# Patient Record
Sex: Male | Born: 1965 | Hispanic: Yes | Marital: Married | Smoking: Never smoker
Health system: Southern US, Community
[De-identification: ages and names within clinical notes are randomized; demographics above are authoritative.]

---

## 2010-04-21 ENCOUNTER — Emergency Department: Payer: Self-pay | Admitting: Emergency Medicine

## 2010-04-22 ENCOUNTER — Emergency Department: Payer: Self-pay | Admitting: Emergency Medicine

## 2012-10-11 ENCOUNTER — Ambulatory Visit: Payer: Self-pay | Admitting: Gastroenterology

## 2012-10-11 LAB — BASIC METABOLIC PANEL
BUN: 12 mg/dL (ref 7–18)
Calcium, Total: 8.7 mg/dL (ref 8.5–10.1)
Chloride: 107 mmol/L (ref 98–107)
Co2: 31 mmol/L (ref 21–32)
Glucose: 118 mg/dL — ABNORMAL HIGH (ref 65–99)
Osmolality: 278 (ref 275–301)
Potassium: 3.3 mmol/L — ABNORMAL LOW (ref 3.5–5.1)

## 2012-10-11 LAB — CBC
MCHC: 34.7 g/dL (ref 32.0–36.0)
Platelet: 274 10*3/uL (ref 150–440)
WBC: 9.9 10*3/uL (ref 3.8–10.6)

## 2014-05-04 IMAGING — CR DG CHEST 2V
1 series · 2 of 2 positions shown · non-contrast
Comparison: none

REASON FOR EXAM: pt states he swallowed a foreign body
COMMENTS:

[Series 1: w chest pa · 0.14mm/px · 2 of 2 slices shown]
[im 1/2]
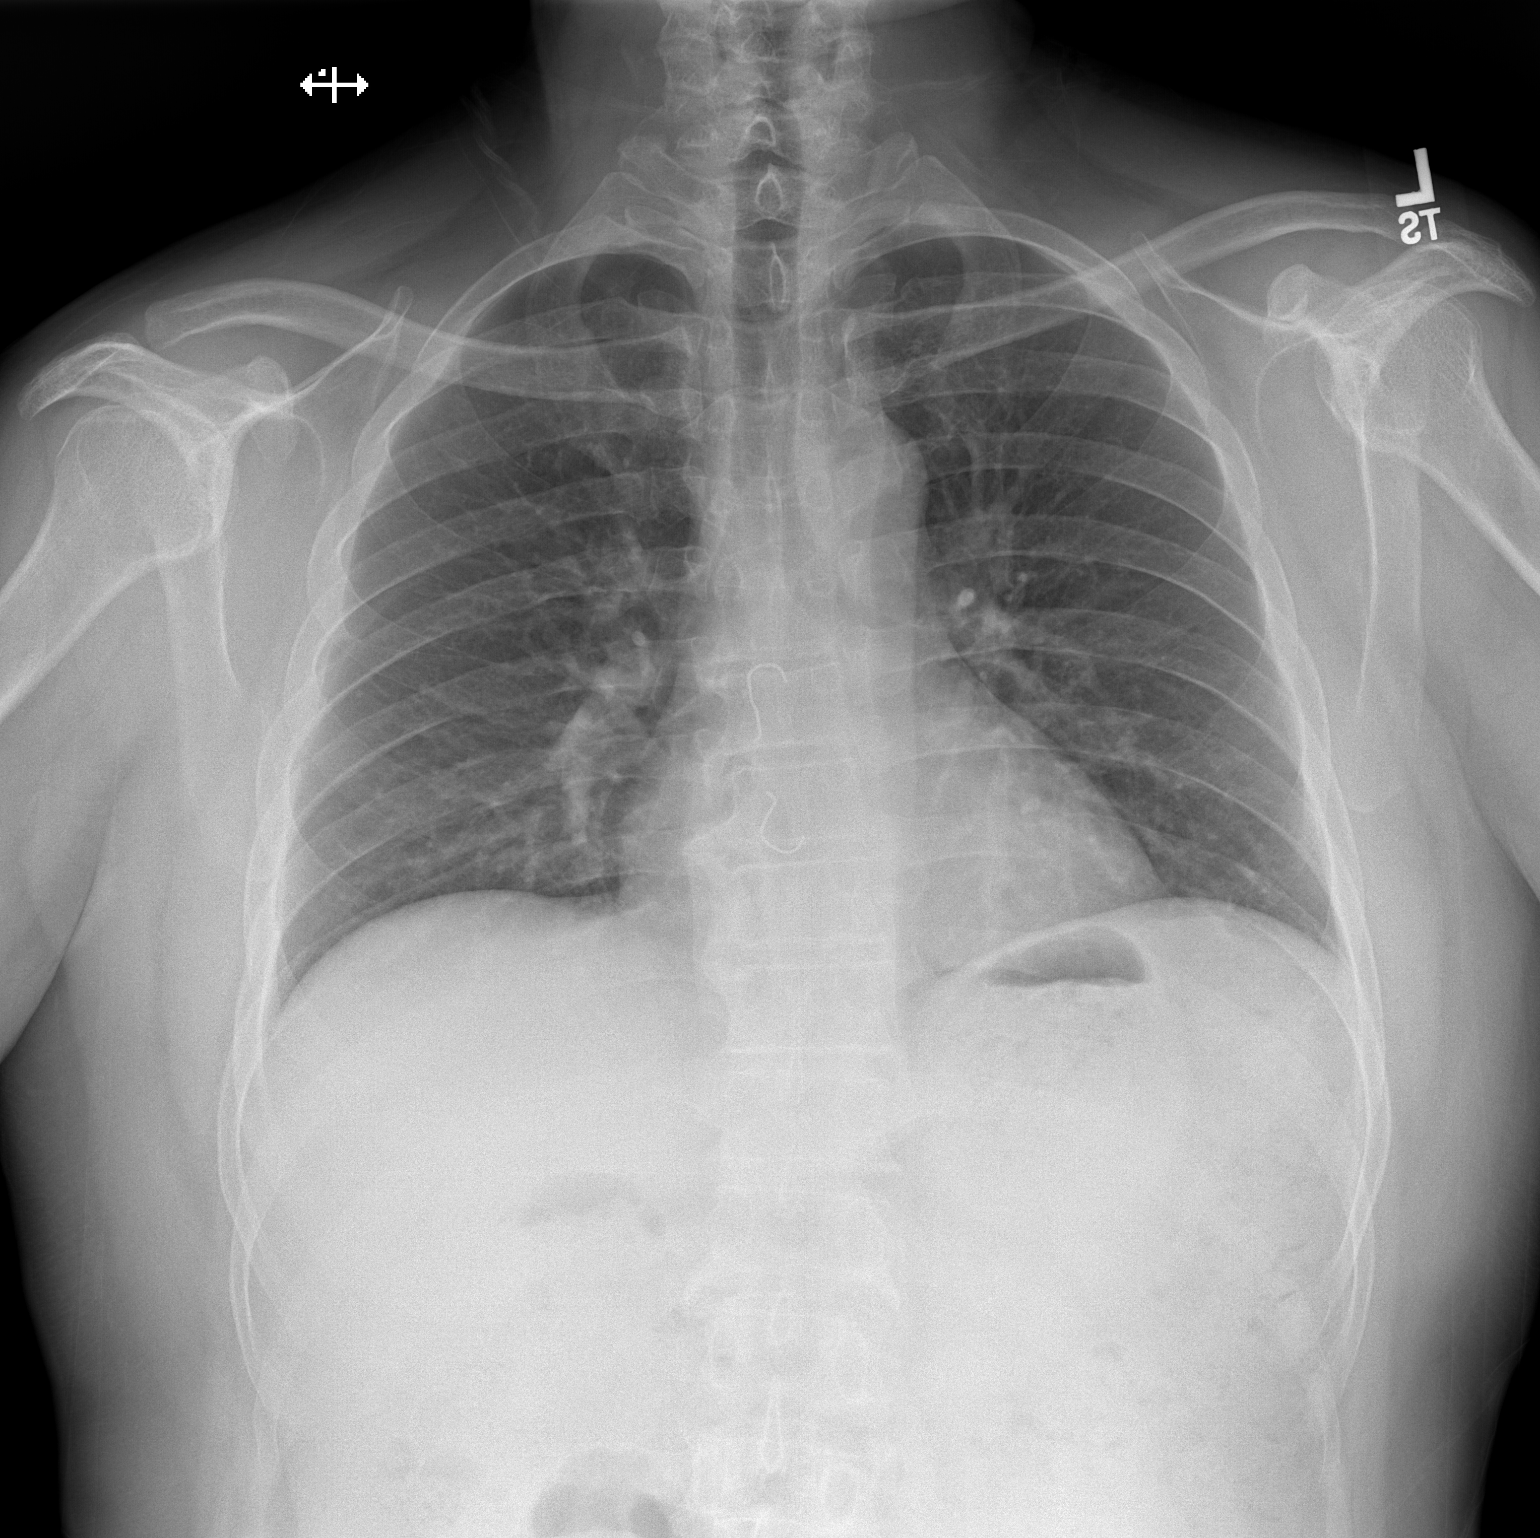
[im 2/2]
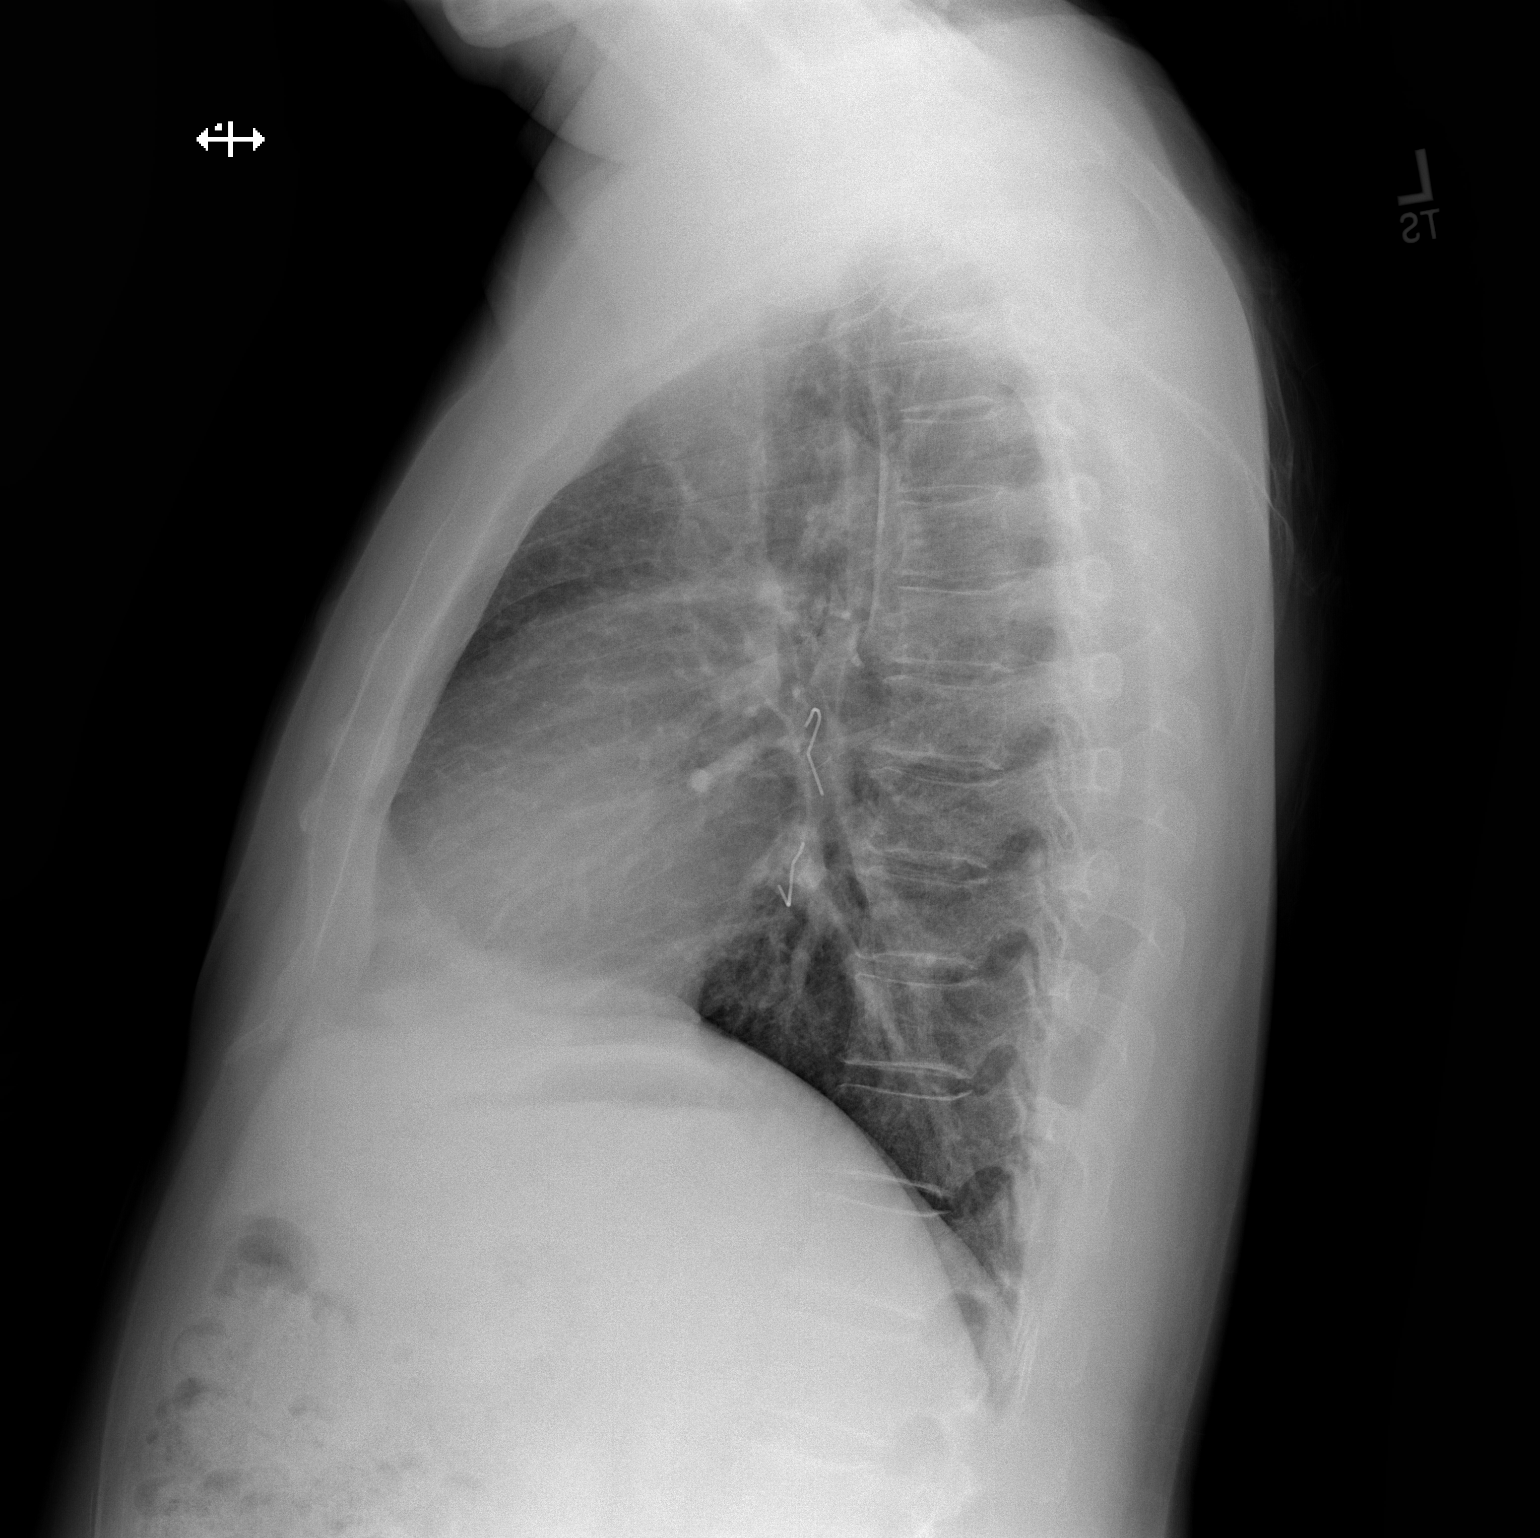

[2 of 2 positions shown; findings below may reference images not displayed]

PROCEDURE:     DXR - DXR CHEST PA (OR AP) AND LATERAL  - October 11, 2012  [DATE]

RESULT:     Curvilinear densities project in in the posterior mediastinal
region consistent with a history of possible ingested foreign body, possibly
in the esophagus. Endoscopic correlation is recommended. The lungs are
clear. The heart and pulmonary vessels are normal. There is no edema,
effusion or pneumothorax. No pneumomediastinum is demonstrated.
IMPRESSION: 1. Findings suggest foreign bodies in the esophagus with 2 curvilinear
metallic densities seen. The GI consultation is recommended.

[REDACTED]

## 2015-01-18 ENCOUNTER — Emergency Department
Admission: EM | Admit: 2015-01-18 | Discharge: 2015-01-18 | Disposition: A | Discharge status: Home / Self Care | Payer: Self-pay | Attending: Emergency Medicine | Admitting: Emergency Medicine

## 2015-01-18 ENCOUNTER — Encounter: Payer: Self-pay | Admitting: Emergency Medicine

## 2015-01-18 DIAGNOSIS — R112 Nausea with vomiting, unspecified: Secondary | ICD-10-CM | POA: Insufficient documentation

## 2015-01-18 DIAGNOSIS — R197 Diarrhea, unspecified: Secondary | ICD-10-CM | POA: Insufficient documentation

## 2015-01-18 LAB — COMPREHENSIVE METABOLIC PANEL
ALT: 14 U/L — ABNORMAL LOW (ref 17–63)
ANION GAP: 9 (ref 5–15)
AST: 27 U/L (ref 15–41)
Albumin: 4.2 g/dL (ref 3.5–5.0)
Alkaline Phosphatase: 122 U/L (ref 38–126)
BILIRUBIN TOTAL: 1 mg/dL (ref 0.3–1.2)
BUN: 15 mg/dL (ref 6–20)
CALCIUM: 8.8 mg/dL — AB (ref 8.9–10.3)
CO2: 24 mmol/L (ref 22–32)
Chloride: 102 mmol/L (ref 101–111)
Creatinine, Ser: 0.74 mg/dL (ref 0.61–1.24)
GFR calc Af Amer: >60 mL/min (ref >60)
GLUCOSE: 138 mg/dL — AB (ref 65–99)
POTASSIUM: 3.5 mmol/L (ref 3.5–5.1)
SODIUM: 135 mmol/L (ref 135–145)
Total Protein: 8.8 g/dL — ABNORMAL HIGH (ref 6.5–8.1)

## 2015-01-18 LAB — CBC
HEMATOCRIT: 44.2 % (ref 40.0–52.0)
HEMOGLOBIN: 14.9 g/dL (ref 13.0–18.0)
MCH: 28.9 pg (ref 26.0–34.0)
MCHC: 33.7 g/dL (ref 32.0–36.0)
MCV: 85.7 fL (ref 80.0–100.0)
PLATELETS: 264 10*3/uL (ref 150–440)
RBC: 5.15 MIL/uL (ref 4.40–5.90)
RDW: 13.6 % (ref 11.5–14.5)
WBC: 5.4 10*3/uL (ref 3.8–10.6)

## 2015-01-18 LAB — LIPASE, BLOOD: LIPASE: 16 U/L — AB (ref 22–51)

## 2015-01-18 MED ORDER — ONDANSETRON HCL 4 MG PO TABS: 4.0000 mg | ORAL_TABLET | Freq: Every day | ORAL | Status: AC | PRN

## 2015-01-18 MED ORDER — ONDANSETRON 4 MG PO TBDP
4.0000 mg | ORAL_TABLET | Freq: Once | ORAL | Status: AC
  Administered 2015-01-18: 4 mg via ORAL
  Filled 2015-01-18: qty 1

## 2015-01-18 NOTE — ED Notes (Signed)
Patient with generalized abd pain, nausea and vomiting since Thursday.

## 2015-01-18 NOTE — Discharge Instructions (Signed)
Tome Zofran / metoclopramida si tiene nuseas. Volver a la sala de urgencias si tiene dolor que empeora o si tiene otras preocupaciones urgentes.  Take zofran/odansetron if you have nausea. Return to the Emergency Department if you have pain that gets worse or if you have other urgent concerns.   Diarrea  (Diarrhea) La diarrea es la materia fecal (heces) acuosas. Puede hacerlo sentir dbil, cansado, sediento, o con la boca seca (signos de deshidratacin). La materia fecal acuosa es signo de otro problema, generalmente una infeccin. Suele durar 2 o 3 das. Puede durar ms tiempo si es el signo de una enfermedad grave. Cudese segn lo que le indique su mdico.  CUIDADOS EN EL HOGAR   Beba 1 taza (8 onzas) de lquido cada vez que tenga una deposicin acuosa.  No beba los siguientes lquidos:  Los que contengan azcares simples (fructosa, glucosa, galactosa, [place], sacarosa, maltosa).  Bebidas deportivas.  Jugos de fruta.  Productos lcteos enteros.  Gaseosas.  Bebidas con cafena (caf, t, gaseosas) o alcohol.  Puede usar soluciones de rehidratacin oral si su mdico lo autoriza. Usted mismo puede preparar la solucin. Siga esta receta:   -  cucharadita de sal.   cucharadita de bicarbonato de soda.   de cucharadita de sal sustituta que contenga cloruro de potasio.  1  cucharada de azcar.  1 litros (34 oz) de agua.  Evite los siguientes alimentos:  Los que tienen gran contenido de [place], como frutas y verduras.  Frutas secas, semillas y panes y cereales integrales.   Las endulzadas con alcohol de azcar (xylitol, sorbitol, manitol).  Trate de comer los siguientes alimentos:  Los que tienen almidn, como arroz, pan, pasta, cereales bajos en azcar, avena, smola de maz, papas al horno, galletas y panecillos.  Bananas.  Pur de [employer].  Consuma alimentos ricos en probiticos, como yogur y productos lcteos fermentados.  Lvese bien las manos cada vez que  tenga una deposicin acuosa.  Slo tome los medicamentos que le haya indicado su mdico.  Tome un bao caliente para ayudar a Engineer, manufacturing systems ardor o dolor que producen las deposiciones aguadas. SOLICITE AYUDA DE INMEDIATO SI:   No puede beber lquidos sin devolver vomitar.  Sigue vomitando.  Tiene sangre en la materia fecal, o es de color negro y de aspecto alquitranado.  No hay emisin de [place] durante 6 a 8 horas o elimina una pequea cantidad de [place].  Usted tiene dolor de estmago (abdominal) que empeora o se mantiene en el mismo lugar (localiza).  Se siente dbil, mareado, confundido o se desmaya.  Siente un dolor de cabeza muy intenso.  La materia fecal acuosa no mejora.  Tiene fiebre o sntomas que persisten durante ms de 2-3 das.  Tiene fiebre y los sntomas empeoran de manera sbita. ASEGRESE DE QUE:   Comprende estas instrucciones.  Controlar su enfermedad.  Solicitar ayuda de inmediato si no mejora o si empeora. Document Released: 05/10/2012 Document Revised: 10/08/2013 [facility] Patient Information 2015 [place], [place]. This information is not intended to replace advice given to you by your health care provider. Make sure you discuss any questions you have with your health care provider.

## 2015-01-18 NOTE — ED Provider Notes (Signed)
[facility] Emergency Department Provider Note  ____________________________________________  Time seen: 6:10 AM  I have reviewed the triage vital signs and the nursing notes.   HISTORY  Chief Complaint Abdominal Pain and Emesis     HPI Russell Holloway is a 49 y.o. male  who reports he has been having nausea, vomiting, some diarrhea, and abdominal discomfort, for the past 3 days.  The reports she has not had significant problem like this recently. He does not have any abdominal issues ordinarily. No one else is sick in the family currently. He denies any other health problems. He does not take any medications.  He reports that anytime he eats or drinks he gets more nauseous and sometimes throws up.   Past medical history: No chronic medical conditions.  There are no active problems to display for this patient.   No past surgical history on file.  Current Outpatient Rx  Name  Route  Sig  Dispense  Refill  . ondansetron (ZOFRAN) 4 MG tablet   Oral   Take 1 tablet (4 mg total) by mouth daily as needed for nausea or vomiting.   10 tablet   0     Allergies Review of patient's allergies indicates no known allergies.  History reviewed. No pertinent family history.  Social History Social History  Substance Use Topics  . Smoking status: Never Smoker   . Smokeless tobacco: None  . Alcohol Use: No    Review of Systems  Constitutional: Negative for fever. ENT: Negative for sore throat. Cardiovascular: Negative for chest pain. Respiratory: Negative for shortness of breath. Gastrointestinal: Positive for abdominal pain, vomiting and diarrhea. Genitourinary: Negative for dysuria. Musculoskeletal: No myalgias or injuries. Skin: Negative for rash. Neurological: Negative for headaches   10-point ROS otherwise negative.  ____________________________________________   PHYSICAL EXAM:  VITAL SIGNS: ED Triage Vitals  Enc Vitals Group   BP 01/18/15 0435 113/84 mmHg     Pulse Rate 01/18/15 0435 87     Resp 01/18/15 0435 18     Temp 01/18/15 0435 98.9 F (37.2 C)     Temp Source 01/18/15 0435 Oral     SpO2 01/18/15 0435 96 %     Weight --      Height 01/18/15 0435  (1.626 m)     Head Cir --      Peak Flow --      Pain Score 01/18/15 0436 7     Pain Loc --      Pain Edu? --      Excl. in GC? --     Constitutional: Alert and oriented. Well appearing and in no distress. ENT   Head: Normocephalic and atraumatic.   Nose: No congestion/rhinnorhea. Cardiovascular: Normal rate, regular rhythm, no murmur noted Respiratory:  Normal respiratory effort, no tachypnea.    Breath sounds are clear and equal bilaterally.  Gastrointestinal: Soft.  Normal bowel sounds . Minimal tenderness in the lower left abdomen. No air to nail signs No distention.  Back: No muscle spasm, no tenderness, no CVA tenderness. Musculoskeletal: No deformity noted. Nontender with normal range of motion in all extremities.  No noted edema. Neurologic:  Normal speech and language. No gross focal neurologic deficits are appreciated.  Skin:  Skin is warm, dry. No rash noted. Psychiatric: Mood and affect are normal. Speech and behavior are normal.  ____________________________________________    LABS (pertinent positives/negatives)  Labs Reviewed  LIPASE, BLOOD - Abnormal; Notable for the following:  Lipase 16 (*)    All other components within normal limits  COMPREHENSIVE METABOLIC PANEL - Abnormal; Notable for the following:    Glucose, Bld 138 (*)    Calcium 8.8 (*)    Total Protein 8.8 (*)    ALT 14 (*)    All other components within normal limits  CBC  URINALYSIS COMPLETEWITH MICROSCOPIC ([facility] ONLY)     ____________________________________________  ____________________________________________   INITIAL IMPRESSION / ASSESSMENT AND PLAN / ED COURSE  Pertinent labs & imaging results that were available during my care of the  patient were reviewed by me and considered in my medical decision making (see chart for details).   Well-appearing 49 year old male in no acute distress. He has minimal discomfort on exam. He has nausea vomiting and diarrhea. I suspect he does have some form of enteritis. His blood tests are overall normal.  I do not think that any imaging, such as CT scan, is indicated at this time. We will treat him for his nausea.  I have advised him to return to the emergency department if he has worsening symptoms or any other urgent concerns.    ____________________________________________   FINAL CLINICAL IMPRESSION(S) / ED DIAGNOSES  Final diagnoses:  Nausea vomiting and diarrhea      Darien Ramus, MD 01/18/15 [phone]

## 2023-06-03 ENCOUNTER — Emergency Department
Admission: EM | Admit: 2023-06-03 | Discharge: 2023-06-03 | Disposition: A | Discharge status: Home / Self Care | Payer: Worker's Compensation | Attending: Emergency Medicine | Admitting: Emergency Medicine

## 2023-06-03 ENCOUNTER — Emergency Department: Payer: Worker's Compensation

## 2023-06-03 ENCOUNTER — Other Ambulatory Visit: Payer: Self-pay

## 2023-06-03 DIAGNOSIS — W228XXA Striking against or struck by other objects, initial encounter: Secondary | ICD-10-CM | POA: Diagnosis not present

## 2023-06-03 DIAGNOSIS — Y990 Civilian activity done for income or pay: Secondary | ICD-10-CM | POA: Diagnosis not present

## 2023-06-03 DIAGNOSIS — S0990XA Unspecified injury of head, initial encounter: Secondary | ICD-10-CM

## 2023-06-03 DIAGNOSIS — S01111A Laceration without foreign body of right eyelid and periocular area, initial encounter: Secondary | ICD-10-CM | POA: Insufficient documentation

## 2023-06-03 NOTE — Discharge Instructions (Addendum)
The CT scan of your head and the xray of your right shoulder were both normal.  The glue that was applied to your eyebrow laceration will begin to fall off in about a week.  Please do not pick at it.  Watch for signs of infection including redness, warmth, swelling, pain and pus drainage.  If you develop any of these please return to the ED, urgent care or your primary care provider.  You can take 650 mg of Tylenol and 600 mg of ibuprofen every 6 hours as needed for pain.

## 2023-06-03 NOTE — ED Triage Notes (Signed)
Was walking down steps and ?slipped and fell landing on concrete floor.  Laceration right forehead.  No loc--per coworkers that were there when it happened.

## 2023-06-03 NOTE — ED Provider Notes (Signed)
[facility] Provider Note    Event Date/Time   First MD Initiated Contact with Patient 06/03/23 1528     (approximate)   History   Head Injury   HPI  Russell Holloway is a 57 y.o. male who presents for evaluation of a head injury that occurred at work today. Patient slipped down some stairs hitting his head and right shoulder. He sustained a small laceration to the right eye brow. Patient reports pain to the right eye brow and right shoulder.  Workman's comp injury.       Physical Exam   Triage Vital Signs: ED Triage Vitals  Encounter Vitals Group     BP 06/03/23 1322 (!) 149/95     Systolic BP Percentile --      Diastolic BP Percentile --      Pulse Rate 06/03/23 1322 79     Resp 06/03/23 1322 16     Temp 06/03/23 1322 98.2 F (36.8 C)     Temp Source 06/03/23 1322 Oral     SpO2 06/03/23 1322 97 %     Weight 06/03/23 1324 140 lb (63.5 kg)     Height --      Head Circumference --      Peak Flow --      Pain Score 06/03/23 1323 10     Pain Loc --      Pain Education --      Exclude from Growth Chart --     Most recent vital signs: Vitals:   06/03/23 1322  BP: (!) 149/95  Pulse: 79  Resp: 16  Temp: 98.2 F (36.8 C)  SpO2: 97%    General: Awake, no distress.  CV:  Good peripheral perfusion.  Resp:  Normal effort.  Abd:  No distention.  Other:     ED Results / Procedures / Treatments   Labs (all labs ordered are listed, but only abnormal results are displayed) Labs Reviewed - No data to display   RADIOLOGY  CT head and right shoulder xray were obtained, I interpreted the images as well as the radiologist report, they were both negative for any acute abnormalities.    PROCEDURES:  Critical Care performed: No  .Laceration Repair  Date/Time: 06/03/2023 3:44 PM  Performed by: Cameron Ali, PA-C Authorized by: Cameron Ali, PA-C   Consent:    Consent obtained:  Verbal   Consent given by:  Patient    Risks, benefits, and alternatives were discussed: yes     Risks discussed:  Infection, pain, poor cosmetic result and poor wound healing   Alternatives discussed:  No treatment Universal protocol:    Patient identity confirmed:  Verbally with patient Anesthesia:    Anesthesia method:  None Laceration details:    Location:  Face   Face location:  R eyebrow   Length (cm):  1   Depth (mm):  1 Treatment:    Area cleansed with:  Saline   Amount of cleaning:  Standard   Irrigation solution:  Sterile saline   Irrigation method:  Syringe Skin repair:    Repair method:  Tissue adhesive Approximation:    Approximation:  Close Repair type:    Repair type:  Simple Post-procedure details:    Dressing:  Open (no dressing)   Procedure completion:  Tolerated well, no immediate complications    MEDICATIONS ORDERED IN ED: Medications - No data to display   IMPRESSION / MDM / ASSESSMENT AND PLAN / ED  COURSE  I reviewed the triage vital signs and the nursing notes.                              Differential diagnosis includes, but is not limited to, closed head injury, concussion, shoulder dislocation, shoulder fracture, muscle strain, laceration.  Patient's presentation is most consistent with acute complicated illness / injury requiring diagnostic workup.  Xray of the shoulder and CT scan of the head were both negative.   Drug test was completed for workmans comp.  Laceration on the right eyebrow was cleaned with saline and closed with dermabond. Further details in the procedure note above. Patient was instructed on wound care. He voiced understanding, all questions were answered and he was stable at discharge.     FINAL CLINICAL IMPRESSION(S) / ED DIAGNOSES   Final diagnoses:  Injury of head, initial encounter     Rx / DC Orders   ED Discharge Orders     None        Note:  This document was prepared using Dragon voice recognition software and may include unintentional  dictation errors.   Cameron Ali, PA-C 06/03/23 1556    Janith Lima, MD 06/03/23 [phone]

## 2023-06-03 NOTE — ED Provider Triage Note (Signed)
Emergency Medicine Provider Triage Evaluation Note  Zaelen Haberland , a 57 y.o. male  was evaluated in triage.  Pt complains of fall today where patient slipped down some stairs and hit his head. Sustained a laceration to the right eye brow. Patient did not lose consciousness.   Workman's comp injury.   Review of Systems  Positive: headache Negative: Vision changes, neck pain  Physical Exam  There were no vitals taken for this visit. Gen:   Awake, no distress   Resp:  Normal effort  MSK:   Moves extremities without difficulty  Other:  Approximately 1 cm laceration to the right eye brow  Medical Decision Making  Medically screening exam initiated at 1:20 PM.  Appropriate orders placed.  Manjinder Caraway was informed that the remainder of the evaluation will be completed by another provider, this initial triage assessment does not replace that evaluation, and the importance of remaining in the ED until their evaluation is complete.     Cameron Ali, PA-C 06/03/23 1328
